# Patient Record
Sex: Male | Born: 1975 | Race: White | Hispanic: No | Marital: Married
Health system: Southern US, Community
[De-identification: ages and names within clinical notes are randomized; demographics above are authoritative.]

## PROBLEM LIST (undated history)

## (undated) HISTORY — PX: HERNIA REPAIR: SHX51

---

## 2015-03-24 ENCOUNTER — Ambulatory Visit: Payer: Self-pay

## 2015-03-24 ENCOUNTER — Other Ambulatory Visit: Payer: Self-pay | Admitting: Occupational Medicine

## 2015-03-24 DIAGNOSIS — R52 Pain, unspecified: Secondary | ICD-10-CM
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
## 2019-10-30 ENCOUNTER — Emergency Department (HOSPITAL_COMMUNITY)
Admission: EM | Admit: 2019-10-30 | Discharge: 2019-10-30 | Disposition: A | Discharge status: Home / Self Care | Payer: Self-pay | Attending: Emergency Medicine | Admitting: Emergency Medicine

## 2019-10-30 ENCOUNTER — Encounter (HOSPITAL_COMMUNITY): Payer: Self-pay | Admitting: Emergency Medicine

## 2019-10-30 ENCOUNTER — Other Ambulatory Visit: Payer: Self-pay

## 2019-10-30 DIAGNOSIS — K047 Periapical abscess without sinus: Secondary | ICD-10-CM | POA: Insufficient documentation

## 2019-10-30 DIAGNOSIS — R079 Chest pain, unspecified: Secondary | ICD-10-CM | POA: Insufficient documentation

## 2019-10-30 DIAGNOSIS — R220 Localized swelling, mass and lump, head: Secondary | ICD-10-CM | POA: Insufficient documentation

## 2019-10-30 DIAGNOSIS — R5383 Other fatigue: Secondary | ICD-10-CM | POA: Insufficient documentation

## 2019-10-30 MED ORDER — AMOXICILLIN-POT CLAVULANATE 875-125 MG PO TABS: 1.0000 | ORAL_TABLET | Freq: Two times a day (BID) | ORAL | 0 refills | Status: AC

## 2019-10-30 MED ORDER — IBUPROFEN 400 MG PO TABS
600.0000 mg | ORAL_TABLET | Freq: Once | ORAL | Status: AC
  Administered 2019-10-30: 600 mg via ORAL
  Filled 2019-10-30: qty 1

## 2019-10-30 MED ORDER — AMOXICILLIN-POT CLAVULANATE 875-125 MG PO TABS
1.0000 | ORAL_TABLET | Freq: Once | ORAL | Status: AC
  Administered 2019-10-30: 1 via ORAL
  Filled 2019-10-30: qty 1

## 2019-10-30 NOTE — ED Triage Notes (Signed)
Pt states he has had dental pain for 1 week with a small what seems like an abscess above his front tooth.

## 2019-10-30 NOTE — Discharge Instructions (Addendum)
It is important that you follow-up with your dentist.  If you do not have a dentist I have given you the information for the dentist who was on-call.  If you do not have a dentist then you may be at the dental office for the dentist on call tomorrow morning when they open (bring your discharge papers) and they should see you tomorrow.   We discussed options for drainage vs treatment with antibiotics, and you elected for antibiotics only for now.   If you wish for evaluation of your chest pain please return to the emergency room.  If develop fevers, shortness of breath, worsening symptoms, worsening swelling, confusion, neck pain or stiffness, or unable to keep down water or have other concerns please seek additional medical care and evaluation.  Please take Ibuprofen (Advil, motrin) and Tylenol (acetaminophen) to relieve your pain.  You may take up to 600 MG (3 pills) of normal strength ibuprofen every 8 hours as needed.  In between doses of ibuprofen you make take tylenol, up to 1,000 mg (two extra strength pills).  Do not take more than 3,000 mg tylenol in a 24 hour period.  Please check all medication labels as many medications such as pain and cold medications may contain tylenol.  Do not drink alcohol while taking these medications.  Do not take other NSAID'S while taking ibuprofen (such as aleve or naproxen).  Please take ibuprofen with food to decrease stomach upset.  You may have diarrhea from the antibiotics.  It is very important that you continue to take the antibiotics even if you get diarrhea unless a medical professional tells you that you may stop taking them.  If you stop too early the bacteria you are being treated for will become stronger and you may need different, more powerful antibiotics that have more side effects and worsening diarrhea.  Please stay well hydrated and consider probiotics as they may decrease the severity of your diarrhea.

## 2019-10-30 NOTE — ED Provider Notes (Signed)
[facility] EMERGENCY DEPARTMENT Provider Note   CSN: [id number] Arrival date & time: 10/30/19  1604     History Chief Complaint  Patient presents with  . Dental Pain    Warren Gonzalez is a 44 y.o. male with no pertinent past medical history who presents today for evaluation of dental pain.  He reports that his pain is worsening over the past week significantly in the past few days.  He states that he feels like left-sided his face feels a little "funny" around his mouth.  He denies any known fevers.  He has not seen a dentist since this started.  He denies any difficulty swallowing however does note that he is not eating and drinking well secondary to pain.  He has not taken any medications for the pain.  He has not recently been on any antibiotics, does not have any known allergies.  He denies drooling, N/V.   He does report mild chest pain starting this morning.  He reports it is mid chest, does not radiate or move.  No difficulty moving his head or his neck.  He denies any cough or shortness of breath.  No aggravating or alleviating factors noted, as he states the chest pain is minor.  He does not have any cardiac history.  HPI     History reviewed. No pertinent past medical history.  There are no problems to display for this patient.   Past Surgical History:  Procedure Laterality Date  . HERNIA REPAIR         No family history on file.  Social History   Tobacco Use  . Smoking status: Not on file  Substance Use Topics  . Alcohol use: Not on file  . Drug use: Not on file    Home Medications Prior to Admission medications   Medication Sig Start Date End Date Taking? Authorizing Provider  amoxicillin-clavulanate (AUGMENTIN) 875-125 MG tablet Take 1 tablet by mouth every 12 (twelve) hours for 10 days. 10/30/19 11/09/19  Cristina Gong, PA-C    Allergies    Patient has no known allergies.  Review of Systems   Review of Systems    Constitutional: Positive for chills and fatigue. Negative for fever.  HENT: Positive for dental problem and facial swelling (Mild, upper lip). Negative for congestion, drooling, sinus pressure, sinus pain, sore throat, tinnitus and trouble swallowing.   Eyes: Negative for visual disturbance.  Respiratory: Negative for cough, chest tightness and shortness of breath.   Cardiovascular: Positive for chest pain. Negative for palpitations and leg swelling.  Gastrointestinal: Negative for abdominal pain, diarrhea, nausea and vomiting.  Musculoskeletal: Negative for back pain and neck pain.  Psychiatric/Behavioral: Negative for confusion.  All other systems reviewed and are negative.   Physical Exam Updated Vital Signs BP (!) 126/92   Pulse 90   Temp 99.3 F (37.4 C) (Oral)   Resp 16   Ht 5\' 5"  (1.651 m)   Wt 72.6 kg   SpO2 100%   BMI 26.63 kg/m   Physical Exam Vitals and nursing note reviewed.  Constitutional:      General: He is not in acute distress.    Appearance: He is not ill-appearing.  HENT:     Head: Normocephalic and atraumatic.     Nose: Nose normal.     Mouth/Throat:     Mouth: Mucous membranes are moist.     Comments: No trismus on exam.  There is a subcentimeter area of edema with mild fluctuance  that is tender to palpation in the upper midline maxilla superior to the central incisors without active drainage.  Uvula is midline.  Phonation is normal.  No drooling.  Dentition is poor with multiple areas of plaque buildup.   Cardiovascular:     Rate and Rhythm: Normal rate.     Heart sounds: Normal heart sounds. No murmur heard.   Pulmonary:     Effort: Pulmonary effort is normal. No respiratory distress.     Breath sounds: Normal breath sounds.  Abdominal:     General: Abdomen is flat.  Musculoskeletal:     Cervical back: Normal range of motion and neck supple. No rigidity or tenderness.  Lymphadenopathy:     Cervical: No cervical adenopathy.  Skin:    General:  Skin is warm and dry.  Neurological:     Mental Status: He is alert and oriented to person, place, and time.     Cranial Nerves: No cranial nerve deficit.     Sensory: Sensory deficit present.     Motor: No weakness.     Gait: Gait normal.     Comments: There is subjective decreased sensation to light touch directly over the left-sided upper lip and under the nose correlating with the area of edema.  Remainder of facial sensation is intact to light touch.  Facial movements are symmetric.  EOM intact.  Smile is symmetrical.  Uvula elevates symmetrically.  Tongue protrusion is midline.  Eyebrows raise symmetrically.  Psychiatric:        Mood and Affect: Mood normal.        Behavior: Behavior normal.     ED Results / Procedures / Treatments   Labs (all labs ordered are listed, but only abnormal results are displayed) Labs Reviewed - No data to display  EKG None  Radiology No results found.  Procedures Procedures (including critical care time)  Medications Ordered in ED Medications  ibuprofen (ADVIL) tablet 600 mg (has no administration in time range)  amoxicillin-clavulanate (AUGMENTIN) 875-125 MG per tablet 1 tablet (has no administration in time range)    ED Course  I have reviewed the triage vital signs and the nursing notes.  Pertinent labs & imaging results that were available during my care of the patient were reviewed by me and considered in my medical decision making (see chart for details).    MDM Rules/Calculators/A&P                         Patient is a otherwise healthy 44 year old man who presents today for evaluation of dental pain and swelling.  Over the past week he has had worsening swelling and pain.  On my exam he has what appears to be a small dental abscess in the midline over the maxilla.  He does have some slight subjective decreased sensation to light touch over the areas of edema however is otherwise neurovascularly intact.  I suspect that this is a  peripheral cause secondary to the edema.  We discussed options for incision and drainage versus attempting treatment with p.o. antibiotics.  Given the small nature it is possible that this can be resolved without emergent I&D.  Patient does not wish for I&D at this time, he would rather follow-up with dentist as an outpatient.  He is aware that he may need I&D in the future.  He also did report chest pain since this morning.  He denies any known cardiac history, does not have any known structural  heart disease, does not have a history of open heart surgery or abnormal/prosthetic valves.  He states that it is mild, is not short of breath.  I discussed with patient evaluation for this which may include, but not limited to, EKG, labs, chest x-ray and additional evaluations as needed.  We discussed possible causes for the pain and that there may be a serious underlying cause of the chest pain.  At this time patient declines further evaluation stating that he wishes to follow up with his PCP if it persists.  He states his understanding and agreement.   OTC pain medications.    Return precautions were discussed with patient who states their understanding.  At the time of discharge patient denied any unaddressed complaints or concerns.  Patient is agreeable for discharge home.  Note: Portions of this report may have been transcribed using voice recognition software. Every effort was made to ensure accuracy; however, inadvertent computerized transcription errors may be present  Final Clinical Impression(s) / ED Diagnoses Final diagnoses:  Dental infection    Rx / DC Orders ED Discharge Orders         Ordered    amoxicillin-clavulanate (AUGMENTIN) 875-125 MG tablet  Every 12 hours     Discontinue  Reprint     10/30/19 1949           Norman Clay 10/30/19 2257    Vanetta Mulders, MD 11/02/19 [phone]

## 2023-11-01 ENCOUNTER — Other Ambulatory Visit (HOSPITAL_COMMUNITY): Payer: Self-pay | Admitting: Adult Health Nurse Practitioner

## 2023-11-01 DIAGNOSIS — I517 Cardiomegaly: Secondary | ICD-10-CM

## 2023-11-02 ENCOUNTER — Ambulatory Visit (HOSPITAL_COMMUNITY)
Admission: RE | Admit: 2023-11-02 | Discharge: 2023-11-02 | Disposition: A | Discharge status: Home / Self Care | Source: Ambulatory Visit | Attending: Adult Health Nurse Practitioner | Admitting: Adult Health Nurse Practitioner

## 2023-11-02 DIAGNOSIS — R9431 Abnormal electrocardiogram [ECG] [EKG]: Secondary | ICD-10-CM

## 2023-11-02 DIAGNOSIS — I517 Cardiomegaly: Secondary | ICD-10-CM | POA: Diagnosis present

## 2023-11-02 LAB — ECHOCARDIOGRAM COMPLETE
Area-P 1/2: 4.96 cm2
Calc EF: 64.1 %
S' Lateral: 3 cm
Single Plane A2C EF: 62.7 %
Single Plane A4C EF: 64.2 %
